# Patient Record
Sex: Male | Born: 1965 | Race: Black or African American | Hispanic: No | Marital: Married | Smoking: Never smoker
Health system: Southern US, Community
[De-identification: ages and names within clinical notes are randomized; demographics above are authoritative.]

## PROBLEM LIST (undated history)

## (undated) DIAGNOSIS — I82409 Acute embolism and thrombosis of unspecified deep veins of unspecified lower extremity: Secondary | ICD-10-CM

## (undated) DIAGNOSIS — C089 Malignant neoplasm of major salivary gland, unspecified: Secondary | ICD-10-CM

## (undated) DIAGNOSIS — I10 Essential (primary) hypertension: Secondary | ICD-10-CM

## (undated) HISTORY — PX: ABDOMINAL SURGERY: SHX537

## (undated) HISTORY — PX: BLADDER SURGERY: SHX569

## (undated) HISTORY — PX: TONSILLECTOMY: SUR1361

---

## 2011-11-12 ENCOUNTER — Emergency Department (HOSPITAL_BASED_OUTPATIENT_CLINIC_OR_DEPARTMENT_OTHER)
Admission: EM | Admit: 2011-11-12 | Discharge: 2011-11-12 | Disposition: A | Discharge status: Home / Self Care | Payer: Managed Care, Other (non HMO) | Attending: Emergency Medicine | Admitting: Emergency Medicine

## 2011-11-12 ENCOUNTER — Encounter (HOSPITAL_BASED_OUTPATIENT_CLINIC_OR_DEPARTMENT_OTHER): Payer: Self-pay | Admitting: *Deleted

## 2011-11-12 DIAGNOSIS — R0600 Dyspnea, unspecified: Secondary | ICD-10-CM

## 2011-11-12 DIAGNOSIS — R0602 Shortness of breath: Secondary | ICD-10-CM | POA: Insufficient documentation

## 2011-11-12 HISTORY — DX: Acute embolism and thrombosis of unspecified deep veins of unspecified lower extremity: I82.409

## 2011-11-12 HISTORY — DX: Essential (primary) hypertension: I10

## 2011-11-12 NOTE — ED Provider Notes (Signed)
History     CSN: [id number]  Arrival date & time 11/12/11  0224   First MD Initiated Contact with Patient 11/12/11 0258      Chief Complaint  Patient presents with  . Shortness of Breath    (Consider location/radiation/quality/duration/timing/severity/associated sxs/prior treatment) HPI This is a 46 year old black male who had a right tonsillectomy 3 days ago to remove a tonsillar mass. He had been doing well, not requiring any pain medicine in over a day and with less subjective sense of swelling in his throat. He awoke this morning just prior to arrival with a sensation that he could not breathe. He states he was breathing and objectively mood that he was breathing but had a subjective sensation of dyspnea. This resolved after about 20 minutes. He is now asymptomatic. There is no bleeding of his throat. He states he was sleeping lying down.  Past Medical History  Diagnosis Date  . Hypertension   . DVT (deep venous thrombosis)     Past Surgical History  Procedure Date  . Tonsillectomy   . Bladder surgery   . Abdominal surgery     No family history on file.  History  Substance Use Topics  . Smoking status: Never Smoker   . Smokeless tobacco: Never Used  . Alcohol Use: No      Review of Systems  All other systems reviewed and are negative.    Allergies  Review of patient's allergies indicates no known allergies.  Home Medications   Current Outpatient Rx  Name Route Sig Dispense Refill  . AMOXICILLIN 400 MG/5ML PO SUSR Oral Take 800 mg by mouth 2 (two) times daily.    Marland Kitchen ENOXAPARIN SODIUM 120 MG/0.8ML [place] SOLN Subcutaneous Inject 120 mg into the skin every 12 (twelve) hours.    Marland Kitchen LISINOPRIL 20 MG PO TABS Oral Take 20 mg by mouth daily.    . OXYCODONE-ACETAMINOPHEN 10-650 MG PO TABS Oral Take 1 tablet by mouth every 6 (six) hours as needed.    . WARFARIN SODIUM 5 MG PO TABS Oral Take 15 mg by mouth daily.      BP 128/76  Pulse 73  Temp 97.5 F (36.4 C)   Resp 18  SpO2 98%  Physical Exam General: Well-developed, well-nourished male in no acute distress; appearance consistent with age of record HENT: normocephalic, atraumatic; oropharynx status post right tonsillectomy with hemostatic wound and mild edema of soft palate; no stridor; no dysphonia Eyes: pupils equal round and reactive to light; extraocular muscles intact Neck: supple Heart: regular rate and rhythm Lungs: clear to auscultation bilaterally Abdomen: soft; nondistended; nontender Extremities: No deformity; full range of motion Neurologic: Awake, alert and oriented; motor function intact in all extremities and symmetric; no facial droop Skin: Warm and dry Psychiatric: Normal mood and affect    ED Course  Procedures (including critical care time)     MDM  Suspect the patient had an apneic episode related to postoperative changes. He was advised to sleep sitting more upright such as in the easy chair or on several pillows.         Hanley Seamen, MD 11/12/11 [phone]

## 2011-11-12 NOTE — ED Notes (Signed)
D/c home with family

## 2011-11-12 NOTE — ED Notes (Signed)
Pt had tonsillectomy on Friday- woke up tonight feeling SOB- EMS transport- pt reports sx resolving upon arrival to ED- speaking full sentences, no respiratory distress

## 2011-11-12 NOTE — Discharge Instructions (Signed)
Dyspnea Shortness of breath (dyspnea) is the feeling of uneasy breathing. Dyspnea should be evaluated promptly. A cause for your shortness of breath may not be identified initially. In this case, it is important to have a follow-up exam with your caregiver. HOME CARE INSTRUCTIONS   Do not smoke. Smoking is a common cause of shortness of breath. Ask for help to stop smoking.   Avoid being around chemicals that may bother your breathing, such as paint fumes or dust.   Rest as needed. Slowly begin your usual activities.   If medications were prescribed, take them as directed for the full length of time directed. This includes oxygen and any inhaled medications, if prescribed.   It is very important that you follow up with your caregiver or other physician as directed. Waiting to do so or failure to follow up could result in worsening of your condition, possible disability, or death.   Be sure you understand what to do or who to call if your shortness of breath worsens.  SEEK MEDICAL CARE IF:   Your condition does not improve in the time expected.   You have a hard time doing your normal activities even with rest.   You have any side effects from or problems with medications prescribed.  SEEK IMMEDIATE MEDICAL CARE IF:   You feel your shortness of breath is getting worse.   You feel lightheaded, faint or develop a cough not controlled with medications.   You start coughing up blood.   You get pain with breathing.   You get chest pain or pain in your arms, shoulders or belly (abdomen).   You have a fever.   You are unable to walk up stairs or exercise the way you normally can.  MAKE SURE YOU:   Understand these instructions.   Will watch your condition.   Will get help right away if you are not doing well or get worse.  Document Released: 07/12/2004 Document Revised: 02/14/2011 Document Reviewed: 10/20/2009 [facility] Patient Information 2012 [place], [place].

## 2012-07-03 ENCOUNTER — Emergency Department (HOSPITAL_BASED_OUTPATIENT_CLINIC_OR_DEPARTMENT_OTHER)
Admission: EM | Admit: 2012-07-03 | Discharge: 2012-07-03 | Disposition: A | Discharge status: Home / Self Care | Payer: Managed Care, Other (non HMO) | Attending: Emergency Medicine | Admitting: Emergency Medicine

## 2012-07-03 ENCOUNTER — Encounter (HOSPITAL_BASED_OUTPATIENT_CLINIC_OR_DEPARTMENT_OTHER): Payer: Self-pay | Admitting: *Deleted

## 2012-07-03 ENCOUNTER — Emergency Department (HOSPITAL_BASED_OUTPATIENT_CLINIC_OR_DEPARTMENT_OTHER): Payer: Managed Care, Other (non HMO)

## 2012-07-03 DIAGNOSIS — Z9889 Other specified postprocedural states: Secondary | ICD-10-CM | POA: Insufficient documentation

## 2012-07-03 DIAGNOSIS — Z7901 Long term (current) use of anticoagulants: Secondary | ICD-10-CM | POA: Insufficient documentation

## 2012-07-03 DIAGNOSIS — R1110 Vomiting, unspecified: Secondary | ICD-10-CM | POA: Insufficient documentation

## 2012-07-03 DIAGNOSIS — Z86718 Personal history of other venous thrombosis and embolism: Secondary | ICD-10-CM | POA: Insufficient documentation

## 2012-07-03 DIAGNOSIS — I10 Essential (primary) hypertension: Secondary | ICD-10-CM | POA: Insufficient documentation

## 2012-07-03 DIAGNOSIS — Z79899 Other long term (current) drug therapy: Secondary | ICD-10-CM | POA: Insufficient documentation

## 2012-07-03 DIAGNOSIS — Z9221 Personal history of antineoplastic chemotherapy: Secondary | ICD-10-CM | POA: Insufficient documentation

## 2012-07-03 DIAGNOSIS — Z85858 Personal history of malignant neoplasm of other endocrine glands: Secondary | ICD-10-CM | POA: Insufficient documentation

## 2012-07-03 HISTORY — DX: Malignant neoplasm of major salivary gland, unspecified: C08.9

## 2012-07-03 LAB — CBC WITH DIFFERENTIAL/PLATELET
Eosinophils Absolute: 0 10*3/uL (ref 0.0–0.7)
Eosinophils Relative: 0 % (ref 0–5)
HCT: 39.6 % (ref 39.0–52.0)
Lymphocytes Relative: 10 % — ABNORMAL LOW (ref 12–46)
Lymphs Abs: 0.5 10*3/uL — ABNORMAL LOW (ref 0.7–4.0)
MCH: 29.9 pg (ref 26.0–34.0)
MCV: 89.8 fL (ref 78.0–100.0)
Monocytes Absolute: 0.3 10*3/uL (ref 0.1–1.0)
RBC: 4.41 MIL/uL (ref 4.22–5.81)
RDW: 14.6 % (ref 11.5–15.5)
WBC: 4.9 10*3/uL (ref 4.0–10.5)

## 2012-07-03 LAB — COMPREHENSIVE METABOLIC PANEL
CO2: 27 mEq/L (ref 19–32)
Calcium: 10 mg/dL (ref 8.4–10.5)
Creatinine, Ser: 1 mg/dL (ref 0.50–1.35)
GFR calc Af Amer: >90 mL/min (ref >90)
GFR calc non Af Amer: 90 mL/min — ABNORMAL LOW (ref >90)
Glucose, Bld: 90 mg/dL (ref 70–99)
Total Protein: 7.6 g/dL (ref 6.0–8.3)

## 2012-07-03 LAB — PROTIME-INR
INR: 4.26 — ABNORMAL HIGH (ref 0.00–1.49)
Prothrombin Time: 38.3 seconds — ABNORMAL HIGH (ref 11.6–15.2)

## 2012-07-03 MED ORDER — PROMETHAZINE HCL 25 MG RE SUPP: 25.0000 mg | Freq: Four times a day (QID) | RECTAL | Status: DC | PRN

## 2012-07-03 MED ORDER — SODIUM CHLORIDE 0.9 % IV SOLN
Freq: Once | INTRAVENOUS | Status: AC
  Administered 2012-07-03: 15:00:00 via INTRAVENOUS

## 2012-07-03 MED ORDER — ONDANSETRON HCL 4 MG/2ML IJ SOLN
4.0000 mg | Freq: Once | INTRAMUSCULAR | Status: AC
  Administered 2012-07-03: 4 mg via INTRAVENOUS
  Filled 2012-07-03: qty 2

## 2012-07-03 MED ORDER — ONDANSETRON 4 MG PO TBDP: 4.0000 mg | ORAL_TABLET | Freq: Three times a day (TID) | ORAL | Status: DC | PRN

## 2012-07-03 NOTE — ED Notes (Addendum)
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  Worse symptoms over the last 2 weeks. Patient states he developed chronic constipation and vomiting since beginning fentanyl patches for pain control.

## 2012-07-03 NOTE — ED Provider Notes (Signed)
History     CSN: [id number]  Arrival date & time 07/03/12  1049   First MD Initiated Contact with Patient 07/03/12 1340      Chief Complaint  Patient presents with  . Emesis    (Consider location/radiation/quality/duration/timing/severity/associated sxs/prior treatment) Patient is a 47 y.o. male presenting with vomiting. The history is provided by the patient. No language interpreter was used.  Emesis  This is a new problem. Episode onset: over 2 months. The problem occurs continuously. The problem has been gradually worsening. The emesis has an appearance of stomach contents. There has been no fever. Pertinent negatives include no diarrhea.  Pt reports he has had throat cancer and had a tonsillectomy and radiation to throat.  Pt has finished treatment.  Pt reports that he continues to be nauseated.  Pt worried that throat is not healing normally.  Pt has not seen ENT since surgery.   Past Medical History  Diagnosis Date  . Hypertension   . DVT (deep venous thrombosis)   . Salivary gland cancer     Past Surgical History  Procedure Date  . Tonsillectomy   . Bladder surgery   . Abdominal surgery     No family history on file.  History  Substance Use Topics  . Smoking status: Never Smoker   . Smokeless tobacco: Never Used  . Alcohol Use: No      Review of Systems  Gastrointestinal: Positive for vomiting. Negative for diarrhea.  All other systems reviewed and are negative.    Allergies  Ketamine  Home Medications   Current Outpatient Rx  Name  Route  Sig  Dispense  Refill  . FENTANYL 12 MCG/HR TD PT72   Transdermal   Place 1 patch onto the skin every 3 (three) days.         . FENTANYL 25 MCG/HR TD PT72   Transdermal   Place 1 patch onto the skin every 3 (three) days.         . TRAMADOL HCL 50 MG PO TABS   Oral   Take 50 mg by mouth every 6 (six) hours as needed.         . AMOXICILLIN 400 MG/5ML PO SUSR   Oral   Take 800 mg by mouth 2 (two)  times daily.         Marland Kitchen ENOXAPARIN SODIUM 120 MG/0.8ML [place] SOLN   Subcutaneous   Inject 120 mg into the skin every 12 (twelve) hours.         Marland Kitchen LISINOPRIL 20 MG PO TABS   Oral   Take 20 mg by mouth daily.         . OXYCODONE-ACETAMINOPHEN 10-650 MG PO TABS   Oral   Take 1 tablet by mouth every 6 (six) hours as needed.         . WARFARIN SODIUM 5 MG PO TABS   Oral   Take 15 mg by mouth daily.           BP 102/68  Pulse 91  Temp 98.8 F (37.1 C) (Oral)  Resp 20  Ht 5\' 8"  (1.727 m)  Wt 215 lb (97.523 kg)  BMI 32.69 kg/m2  SpO2 100%  Physical Exam  Nursing note and vitals reviewed. Constitutional: He appears well-developed and well-nourished.  HENT:  Head: Normocephalic and atraumatic.  Eyes: Conjunctivae normal are normal. Pupils are equal, round, and reactive to light.  Neck: Normal range of motion. Neck supple.  Cardiovascular: Normal rate.   Pulmonary/Chest:  Effort normal.  Abdominal: Soft. Bowel sounds are normal.  Musculoskeletal: Normal range of motion.  Neurological: He is alert.  Skin: Skin is warm.    ED Course  Procedures (including critical care time)   Labs Reviewed  URINALYSIS, ROUTINE W REFLEX MICROSCOPIC   No results found.   1. Vomiting       MDM    Pt given IV fluids and zofran.   Pt reports he feels some better.  Labs obtained.   Kub shows some constipation.   Pt given rx for phenergan and zofran.   I advised pt to see his ENT for evaluation.       Lonia Skinner [place], [place] 07/03/12 2005

## 2012-07-06 NOTE — ED Provider Notes (Signed)
Medical screening examination/treatment/procedure(s) were performed by non-physician practitioner and as supervising physician I was immediately available for consultation/collaboration.   Shelda Jakes, MD 07/06/12 1414

## 2020-09-15 ENCOUNTER — Emergency Department (HOSPITAL_BASED_OUTPATIENT_CLINIC_OR_DEPARTMENT_OTHER): Payer: Medicare HMO

## 2020-09-15 ENCOUNTER — Other Ambulatory Visit: Payer: Self-pay

## 2020-09-15 ENCOUNTER — Emergency Department (HOSPITAL_BASED_OUTPATIENT_CLINIC_OR_DEPARTMENT_OTHER)
Admission: EM | Admit: 2020-09-15 | Discharge: 2020-09-15 | Disposition: A | Discharge status: Home / Self Care | Payer: Medicare HMO | Attending: Emergency Medicine | Admitting: Emergency Medicine

## 2020-09-15 ENCOUNTER — Encounter (HOSPITAL_BASED_OUTPATIENT_CLINIC_OR_DEPARTMENT_OTHER): Payer: Self-pay | Admitting: *Deleted

## 2020-09-15 DIAGNOSIS — I10 Essential (primary) hypertension: Secondary | ICD-10-CM | POA: Diagnosis not present

## 2020-09-15 DIAGNOSIS — N23 Unspecified renal colic: Secondary | ICD-10-CM | POA: Diagnosis not present

## 2020-09-15 DIAGNOSIS — R109 Unspecified abdominal pain: Secondary | ICD-10-CM | POA: Diagnosis present

## 2020-09-15 DIAGNOSIS — R55 Syncope and collapse: Secondary | ICD-10-CM | POA: Insufficient documentation

## 2020-09-15 DIAGNOSIS — Z85818 Personal history of malignant neoplasm of other sites of lip, oral cavity, and pharynx: Secondary | ICD-10-CM | POA: Diagnosis not present

## 2020-09-15 DIAGNOSIS — Z79899 Other long term (current) drug therapy: Secondary | ICD-10-CM | POA: Insufficient documentation

## 2020-09-15 LAB — CBC WITH DIFFERENTIAL/PLATELET
Abs Immature Granulocytes: 0.02 10*3/uL (ref 0.00–0.07)
Basophils Absolute: 0.1 10*3/uL (ref 0.0–0.1)
Basophils Relative: 1 %
Eosinophils Absolute: 0.1 10*3/uL (ref 0.0–0.5)
Eosinophils Relative: 1 %
HCT: 43 % (ref 39.0–52.0)
Hemoglobin: 14.1 g/dL (ref 13.0–17.0)
Immature Granulocytes: 0 %
Lymphocytes Relative: 22 %
Lymphs Abs: 1.8 10*3/uL (ref 0.7–4.0)
MCH: 30.2 pg (ref 26.0–34.0)
MCHC: 32.8 g/dL (ref 30.0–36.0)
MCV: 92.1 fL (ref 80.0–100.0)
Monocytes Absolute: 0.4 10*3/uL (ref 0.1–1.0)
Monocytes Relative: 5 %
Neutro Abs: 6 10*3/uL (ref 1.7–7.7)
Neutrophils Relative %: 71 %
Platelets: 246 10*3/uL (ref 150–400)
RBC: 4.67 MIL/uL (ref 4.22–5.81)
RDW: 14.6 % (ref 11.5–15.5)
WBC: 8.4 10*3/uL (ref 4.0–10.5)
nRBC: 0 % (ref 0.0–0.2)

## 2020-09-15 LAB — PROTIME-INR
INR: 1.1 (ref 0.8–1.2)
Prothrombin Time: 13.5 seconds (ref 11.4–15.2)

## 2020-09-15 LAB — COMPREHENSIVE METABOLIC PANEL
ALT: 19 U/L (ref 0–44)
AST: 28 U/L (ref 15–41)
Albumin: 4.6 g/dL (ref 3.5–5.0)
Alkaline Phosphatase: 73 U/L (ref 38–126)
Anion gap: 11 (ref 5–15)
BUN: 16 mg/dL (ref 6–20)
CO2: 23 mmol/L (ref 22–32)
Calcium: 8.8 mg/dL — ABNORMAL LOW (ref 8.9–10.3)
Chloride: 104 mmol/L (ref 98–111)
Creatinine, Ser: 1.41 mg/dL — ABNORMAL HIGH (ref 0.61–1.24)
GFR, Estimated: 60 mL/min — ABNORMAL LOW (ref >60)
Glucose, Bld: 157 mg/dL — ABNORMAL HIGH (ref 70–99)
Potassium: 3.6 mmol/L (ref 3.5–5.1)
Sodium: 138 mmol/L (ref 135–145)
Total Bilirubin: 0.4 mg/dL (ref 0.3–1.2)
Total Protein: 7.2 g/dL (ref 6.5–8.1)

## 2020-09-15 LAB — CBG MONITORING, ED: Glucose-Capillary: 145 mg/dL — ABNORMAL HIGH (ref 70–99)

## 2020-09-15 LAB — LIPASE, BLOOD: Lipase: 40 U/L (ref 11–51)

## 2020-09-15 MED ORDER — IOHEXOL 300 MG/ML  SOLN
100.0000 mL | Freq: Once | INTRAMUSCULAR | Status: AC | PRN
  Administered 2020-09-15: 100 mL via INTRAVENOUS

## 2020-09-15 MED ORDER — PROMETHAZINE HCL 25 MG/ML IJ SOLN
INTRAMUSCULAR | Status: AC
  Filled 2020-09-15: qty 1

## 2020-09-15 MED ORDER — ONDANSETRON HCL 4 MG/2ML IJ SOLN: 4.0000 mg | Freq: Once | INTRAMUSCULAR | Status: DC

## 2020-09-15 MED ORDER — LORAZEPAM 2 MG/ML IJ SOLN
1.0000 mg | Freq: Once | INTRAMUSCULAR | Status: AC
  Administered 2020-09-15: 1 mg via INTRAVENOUS

## 2020-09-15 MED ORDER — ONDANSETRON HCL 4 MG/2ML IJ SOLN
4.0000 mg | Freq: Once | INTRAMUSCULAR | Status: DC
  Administered 2020-09-15: 4 mg via INTRAVENOUS

## 2020-09-15 MED ORDER — SODIUM CHLORIDE 0.9 % IV SOLN: INTRAVENOUS | Status: DC | PRN

## 2020-09-15 MED ORDER — SODIUM CHLORIDE 0.9 % IV SOLN
25.0000 mg | Freq: Once | INTRAVENOUS | Status: AC
  Administered 2020-09-15: 25 mg via INTRAVENOUS
  Filled 2020-09-15: qty 1

## 2020-09-15 MED ORDER — ONDANSETRON 4 MG PO TBDP: 4.0000 mg | ORAL_TABLET | Freq: Three times a day (TID) | ORAL | 0 refills | Status: AC | PRN

## 2020-09-15 MED ORDER — MORPHINE SULFATE (PF) 4 MG/ML IV SOLN
4.0000 mg | Freq: Once | INTRAVENOUS | Status: AC
  Administered 2020-09-15: 4 mg via INTRAVENOUS
  Filled 2020-09-15: qty 1

## 2020-09-15 MED ORDER — KETOROLAC TROMETHAMINE 15 MG/ML IJ SOLN
30.0000 mg | Freq: Once | INTRAMUSCULAR | Status: AC
  Administered 2020-09-15: 30 mg via INTRAVENOUS
  Filled 2020-09-15: qty 2

## 2020-09-15 MED ORDER — TRAMADOL HCL 50 MG PO TABS
50.0000 mg | ORAL_TABLET | Freq: Four times a day (QID) | ORAL | 0 refills | Status: AC | PRN
Start: 2020-09-15 — End: ?

## 2020-09-15 MED ORDER — TAMSULOSIN HCL 0.4 MG PO CAPS: 0.4000 mg | ORAL_CAPSULE | Freq: Every day | ORAL | 0 refills | Status: AC

## 2020-09-15 MED ORDER — ONDANSETRON HCL 4 MG/2ML IJ SOLN
4.0000 mg | Freq: Once | INTRAMUSCULAR | Status: DC
  Filled 2020-09-15: qty 2

## 2020-09-15 MED ORDER — LORAZEPAM 2 MG/ML IJ SOLN
INTRAMUSCULAR | Status: AC
  Filled 2020-09-15: qty 1

## 2020-09-15 NOTE — ED Notes (Signed)
EDP at bedside to discuss CT Head results and current plan of care.

## 2020-09-15 NOTE — ED Notes (Signed)
12 lead ECG obtained

## 2020-09-15 NOTE — ED Notes (Signed)
BEFAST ASSESSMENT - NEGATIVE VAN ASSESSMENT - NEGATIVE

## 2020-09-15 NOTE — ED Notes (Signed)
Pt retrieved from parking lot, found supine on pavement, placed on LSB by nursing staff and brought to exam room 7. Placed on cont cardiac monitoring and cont POX.

## 2020-09-15 NOTE — ED Notes (Signed)
Pt actively vomiting at this time, vomited a large amt of emesis x 2, EDP at bedside, denies HA, remains very alert and oriented, verbal orders rec for 4mg  Zofran IV push. RT at bedside to ensure airway patency. Suction set up. Safety measures remain in place.

## 2020-09-15 NOTE — ED Triage Notes (Signed)
Began having abdominal pain, onset approx 2100hrs, at approx 0030hrs, family states he vomited x 2, abd pain was worse. Pt described his pain as stabbing pain. Family noted he began to stumble.

## 2020-09-15 NOTE — ED Provider Notes (Signed)
[facility] DEPART[facility] Provider Note   CSN: [id number] Arrival date & time: 09/15/20  0048     History Chief Complaint  Patient presents with  . Abdominal Pain    LEFT SIDED ABD PAIN    Derek Peters is a 55 y.o. male.  HPI     This a 55 year old male with a history of DVT and hypertension who presents from triage with reported complaints of abdominal pain.  Nursing staff was called emergently to the parking lot as patient stepped out of the car and fell to the ground.  He was noted by nursing to have seizure-like activity.  No known history of seizures per the family.  Per family member at the bedside, reports that he started complaining of some left-sided abdominal discomfort earlier this evening.  He had multiple episodes of nonbilious, nonbloody emesis.  He did not complain of any headache and no noted strokelike symptoms.  Upon arrival to the room, patient was initially confused and did not know his name.  However, he subsequently was able to tell me where he was and his full name.  He denies any headache at this time.  Is complaining of left abdominal pain which he has never had before.  Limited history secondary to acute presentation Level 5 caveat  Past Medical History:  Diagnosis Date  . DVT (deep venous thrombosis) ([place])   . Hypertension   . 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   Past Surgical History:  Procedure Laterality Date  . ABDOMINAL SURGERY    . BLADDER SURGERY    . TONSILLECTOMY         History reviewed. No pertinent family history.  Social History   Tobacco Use  . Smoking status: Never Smoker  . Smokeless tobacco: Never Used  Vaping Use  . Vaping Use: Never used  Substance Use Topics  . Alcohol use: No  . Drug use: No    Home Medications Prior to Admission medications   Medication Sig Start Date End Date Taking? Authorizing Provider  ondansetron (ZOFRAN ODT) 4 MG  disintegrating tablet Take 1 tablet (4 mg total) by mouth every 8 (eight) hours as needed. 09/15/20  Yes Shelsie Tijerino, Barbette Hair, MD  tamsulosin (FLOMAX) 0.4 MG CAPS capsule Take 1 capsule (0.4 mg total) by mouth daily. 09/15/20  Yes Garlin Batdorf, Barbette Hair, MD  traMADol (ULTRAM) 50 MG tablet Take 1 tablet (50 mg total) by mouth every 6 (six) hours as needed. 09/15/20  Yes Tianni Escamilla, Barbette Hair, MD  fentaNYL (DURAGESIC - DOSED MCG/HR) 25 MCG/HR Place 1 patch onto the skin every 3 (three) days.    [provider]  lisinopril (PRINIVIL,ZESTRIL) 20 MG tablet Take 20 mg by mouth daily.    [provider]    Allergies    Ketamine  Review of Systems   Review of Systems  Unable to perform ROS: Mental status change (Postictal)    Physical Exam Updated Vital Signs BP 117/78   Pulse 83   Temp 98.2 F (36.8 C) (Oral)   Resp 19   Ht 1.727 m (5\' 8" )   Wt 125.5 kg   SpO2 96%   BMI 42.07 kg/m   Physical Exam Vitals and nursing note reviewed.  Constitutional:      Appearance: He is well-developed. He is obese.  HENT:     Head: Normocephalic and atraumatic.  Eyes:     Extraocular Movements: Extraocular movements intact.  Pupils: Pupils are equal, round, and reactive to light.     Comments: Pupils 3 mm and reactive bilaterally  Cardiovascular:     Rate and Rhythm: Regular rhythm. Tachycardia present.     Heart sounds: Normal heart sounds. No murmur heard.   Pulmonary:     Effort: Pulmonary effort is normal. No respiratory distress.     Breath sounds: Normal breath sounds. No wheezing.  Abdominal:     General: Bowel sounds are normal.     Palpations: Abdomen is soft.     Tenderness: There is abdominal tenderness in the left upper quadrant. There is no guarding or rebound.  Musculoskeletal:     Cervical back: Neck supple.  Lymphadenopathy:     Cervical: No cervical adenopathy.  Skin:    General: Skin is warm and dry.  Neurological:     Mental Status: He is alert.      Comments: Initially confused but ultimately oriented x3, 5 out of 5 strength in all 4 extremities, no drift, patient can name and repeat  Psychiatric:        Mood and Affect: Mood normal.     ED Results / Procedures / Treatments   Labs (all labs ordered are listed, but only abnormal results are displayed) Labs Reviewed  COMPREHENSIVE METABOLIC PANEL - Abnormal; Notable for the following components:      Result Value   Glucose, Bld 157 (*)    Creatinine, Ser 1.41 (*)    Calcium 8.8 (*)    GFR, Estimated 60 (*)    All other components within normal limits  CBG MONITORING, ED - Abnormal; Notable for the following components:   Glucose-Capillary 145 (*)    All other components within normal limits  CBC WITH DIFFERENTIAL/PLATELET  LIPASE, BLOOD  PROTIME-INR  RAPID URINE DRUG SCREEN, HOSP PERFORMED  ETHANOL  URINALYSIS, ROUTINE W REFLEX MICROSCOPIC    EKG EKG Interpretation  Date/Time:  Thursday September 15 2020 00:55:05 EDT Ventricular Rate:  105 PR Interval:  196 QRS Duration: 98 QT Interval:  346 QTC Calculation: 458 R Axis:   38 Text Interpretation: Sinus tachycardia Borderline T abnormalities, inferior leads Confirmed by Thayer Jew [phone]) on 09/15/2020 12:59:17 AM   Radiology CT Head Wo Contrast  Result Date: 09/15/2020 CLINICAL DATA:  Recent seizure activity EXAM: CT HEAD WITHOUT CONTRAST CT CERVICAL SPINE WITHOUT CONTRAST TECHNIQUE: Multidetector CT imaging of the head and cervical spine was performed following the standard protocol without intravenous contrast. Multiplanar CT image reconstructions of the cervical spine were also generated. 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 No findings to suggest acute hemorrhage, acute infarction or space-occupying mass lesion are noted. Vascular: No hyperdense vessel or unexpected calcification. Skull: Normal. Negative  for fracture or focal lesion. Sinuses/Orbits: No acute finding. Other: None. CT CERVICAL SPINE FINDINGS Alignment: Within normal limits. Skull base and vertebrae: 7 cervical segments are well visualized. Vertebral body height is well maintained. Mild disc space narrowing at C5-6 and C6-7 is noted with mild osteophytic changes. The odontoid is within normal limits. No acute fracture or acute facet abnormality is noted. Soft tissues and spinal canal: Surrounding soft tissues are limits. Upper chest: No acute abnormality is noted. Other: None IMPRESSION: CT of the head: Chronic left posterior parietal infarct. No acute abnormality noted. CT of the cervical spine: Degenerative change without acute abnormality. Electronically Signed   By: Inez Catalina M.D.   On:  09/15/2020 01:25   CT Cervical Spine Wo Contrast  Result Date: 09/15/2020 CLINICAL DATA:  Recent seizure activity EXAM: CT HEAD WITHOUT CONTRAST CT CERVICAL SPINE WITHOUT CONTRAST TECHNIQUE: Multidetector CT imaging of the head and cervical spine was performed following the standard protocol without intravenous contrast. Multiplanar CT image reconstructions of the cervical spine were also generated. 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 No findings to suggest acute hemorrhage, acute infarction or space-occupying mass lesion are noted. Vascular: No hyperdense vessel or unexpected calcification. Skull: Normal. Negative for fracture or focal lesion. Sinuses/Orbits: No acute finding. Other: None. CT CERVICAL SPINE FINDINGS Alignment: Within normal limits. Skull base and vertebrae: 7 cervical segments are well visualized. Vertebral body height is well maintained. Mild disc space narrowing at C5-6 and C6-7 is noted with mild osteophytic changes. The odontoid is within normal limits. No acute fracture or acute facet abnormality is noted. Soft tissues and  spinal canal: Surrounding soft tissues are limits. Upper chest: No acute abnormality is noted. Other: None IMPRESSION: CT of the head: Chronic left posterior parietal infarct. No acute abnormality noted. CT of the cervical spine: Degenerative change without acute abnormality. Electronically Signed   By: Inez Catalina M.D.   On: 09/15/2020 01:25   CT ABDOMEN PELVIS W CONTRAST  Result Date: 09/15/2020 CLINICAL DATA:  Left-sided abdominal pain, nausea and vomiting EXAM: CT ABDOMEN AND PELVIS WITH CONTRAST TECHNIQUE: Multidetector CT imaging of the abdomen and pelvis was performed using the standard protocol following bolus administration of intravenous contrast. CONTRAST:  140mL OMNIPAQUE IOHEXOL 300 MG/ML  SOLN COMPARISON:  None. FINDINGS: Lower chest: No acute abnormality. Hepatobiliary: No focal liver abnormality is seen. No gallstones, gallbladder wall thickening, or biliary dilatation. Pancreas: Unremarkable. No pancreatic ductal dilatation or surrounding inflammatory changes. Spleen: Normal in size without focal abnormality. Adrenals/Urinary Tract: Adrenal glands are within normal limits. The left kidney demonstrates nonobstructing calculi measuring up to 6 mm. Hydronephrosis and hydroureter is noted secondary to a 1 mm stone at the left UVJ. Right kidney demonstrates Peripelvic cysts without obstructive change. Bladder is decompressed. Stomach/Bowel: The appendix is not well visualized. No inflammatory changes to suggest appendicitis are noted. No obstructive or inflammatory changes of the colon are seen. Small bowel and stomach appear within normal limits. Vascular/Lymphatic: No enlarged abdominal or pelvic lymph nodes. Multiple venous collaterals are identified in the abdominal wall secondary to calcified thrombus within the iliac veins bilateral consistent with chronic DVT. The suprarenal IVC is within normal limits. The aorta is within normal limits. Reproductive: Prostate is unremarkable. Other: No  abdominal wall hernia or abnormality. No abdominopelvic ascites. Musculoskeletal: No acute or significant osseous findings. IMPRESSION: 1 mm left UVJ stone with moderate obstructive change. Nonobstructing left renal calculi. Changes consistent with calcification in the iliac veins bilaterally related to prior DVT. The infrarenal IVC is not well visualized and multiple venous collaterals are identified within the abdominal wall. Electronically Signed   By: Inez Catalina M.D.   On: 09/15/2020 02:33    Procedures Procedures   CRITICAL CARE Performed by: Merryl Hacker   Total critical care time: 33 minutes  Critical care time was exclusive of separately billable procedures and treating other patients.  Critical care was necessary to treat or prevent imminent or life-threatening deterioration.  Critical care was time spent personally by me on the following activities: development of treatment plan with patient and/or surrogate as well as nursing, discussions  with consultants, evaluation of patient's response to treatment, examination of patient, obtaining history from patient or surrogate, ordering and performing treatments and interventions, ordering and review of laboratory studies, ordering and review of radiographic studies, pulse oximetry and re-evaluation of patient's condition.   Medications Ordered in ED Medications  promethazine (PHENERGAN) 25 MG/ML injection (  Not Given 09/15/20 0336)  0.9 %  sodium chloride infusion ( Intravenous Infusion Verify 09/15/20 0342)  promethazine (PHENERGAN) 25 MG/ML injection (  Not Given 09/15/20 0339)  LORazepam (ATIVAN) injection 1 mg (1 mg Intravenous Given 09/15/20 0059)  morphine 4 MG/ML injection 4 mg (4 mg Intravenous Given 09/15/20 0148)  iohexol (OMNIPAQUE) 300 MG/ML solution 100 mL (100 mLs Intravenous Contrast Given 09/15/20 0158)  promethazine (PHENERGAN) 25 mg in sodium chloride 0.9 % 50 mL IVPB ( Intravenous Stopped 09/15/20 0336)  ketorolac  (TORADOL) 15 MG/ML injection 30 mg (30 mg Intravenous Given 09/15/20 0301)    ED Course  I have reviewed the triage vital signs and the nursing notes.  Pertinent labs & imaging results that were available during my care of the patient were reviewed by me and considered in my medical decision making (see chart for details).  Clinical Course as of 09/15/20 0542  Thu Sep 15, 2020  0538 Recently discussed patient with neurology, Dr. Leonel Ramsay, given history of significant stroke which could increase his seizure risk.  However, seizure does not fit his clinical scenario.  I am highly suspicious he may have had a syncopal episode with some myoclonus.  Regardless, given one-time episode, no need to start on prophylactic seizure medication per Dr. Leonel Ramsay. [CH]    Clinical Course User Index [CH] Keriana Sarsfield, Barbette Hair, MD   MDM Rules/Calculators/A&P                          Patient presents with abdominal pain.  He had an episode of loss of consciousness in the parking lot.  Seizure versus syncope.  On my initial evaluation he is confused but nontoxic.  Confusion cleared rapidly and his exam was nonfocal.  He had ongoing episodes of recurrent emesis and complaints of left abdominal pain.  He was given fluids, pain and nausea medication.  CT scan of the head was obtained given fall and shows no evidence of acute bleed.  He does have a significant old parietal stroke.  Labs obtained.  CT abdomen obtained to rule out intra-abdominal pathology.  Given vomiting and left-sided abdominal pain, kidney stone, colitis, obstruction are all considerations.  CT scan shows an obstructing 1 mm stone at the UVJ.  Highly suspect this is the etiology of the patient's abdominal pain and vomiting.  Given his recurrent retching, feel his episode in the parking lot was likely vasovagal in nature and he may have had some associated myoclonus.  See discussion above with neurology.  Patient is resting comfortably on multiple  rechecks.  He has been able to tolerate fluids.  Denies any pain at this time.  He and his wife are given instructions regarding expectant management for kidney stones.  Urology follow-up provided.  Will discharge with short course of pain medication, nausea medication, and Flomax.    Final Clinical Impression(s) / ED Diagnoses Final diagnoses:  Renal colic  Vasovagal syncope    Rx / DC Orders ED Discharge Orders         Ordered    traMADol (ULTRAM) 50 MG tablet  Every 6 hours PRN  09/15/20 0541    ondansetron (ZOFRAN ODT) 4 MG disintegrating tablet  Every 8 hours PRN        09/15/20 0541    tamsulosin (FLOMAX) 0.4 MG CAPS capsule  Daily        09/15/20 0541           Merryl Hacker, MD 09/15/20 [phone]

## 2020-09-15 NOTE — ED Notes (Signed)
To CT Scan via stretcher with RN, sr x 2 up with cont cardiac monitoring

## 2020-09-15 NOTE — ED Notes (Signed)
CBG obtained 145mg /dl

## 2020-09-15 NOTE — Discharge Instructions (Addendum)
You were seen today for abdominal pain.  He did have an episode of loss of consciousness which is likely passing out and not a seizure although given your history of stroke, you would be at increased risk.  Regardless, you do not need to be started on seizure medications.  Take pain and nausea medications as prescribed.  Follow-up with urology as needed if pain recurs.  Your stone is 1 mm and will likely pass on its own.

## 2020-09-15 NOTE — ED Notes (Signed)
ED Provider at bedside. 

## 2020-09-15 NOTE — ED Notes (Signed)
Seizure pads applied to stretcher, HIGH FALL RISK bracelet applied to rt arm.

## 2021-11-23 IMAGING — CT CT CERVICAL SPINE W/O CM
3 of 4 series · 12 of 33 positions shown, 14 images · non-contrast
Comparison: 02/13/2016

CLINICAL DATA: Recent seizure activity

EXAM:
CT HEAD WITHOUT CONTRAST
CT CERVICAL SPINE WITHOUT CONTRAST
TECHNIQUE: Multidetector CT imaging of the head and cervical spine was
performed following the standard protocol without intravenous
contrast. Multiplanar CT image reconstructions of the cervical spine
were also generated.

[Series 3: c_spine 2.0 i30s 3 · axial · 0.33mm/px · z∈[-245,-137]mm · 4 of 82 slices shown, 5 images]
[im 14/82  soft-tissue]
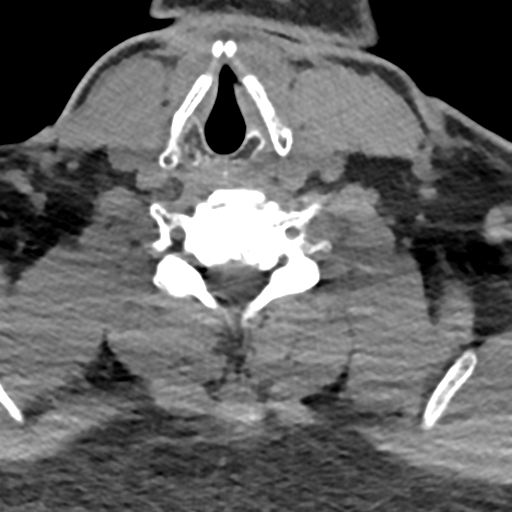
[im 14/82  bone]
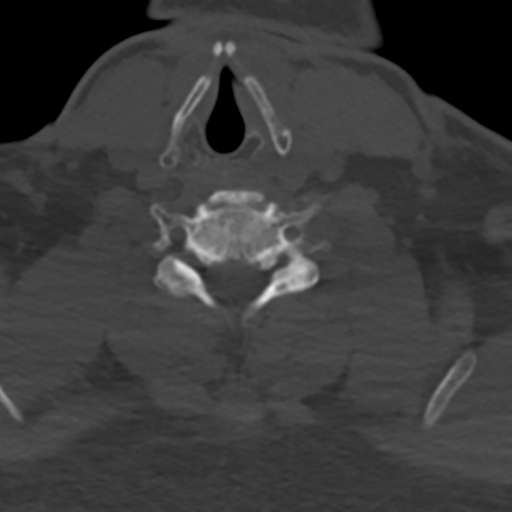
[im 28/82  bone]
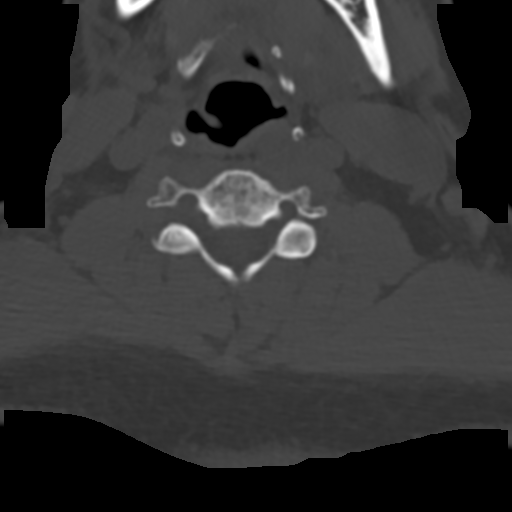
[im 55/82  bone]
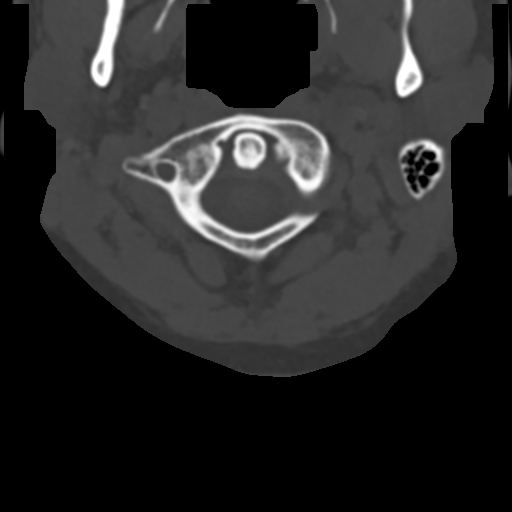
[im 68/82  bone]
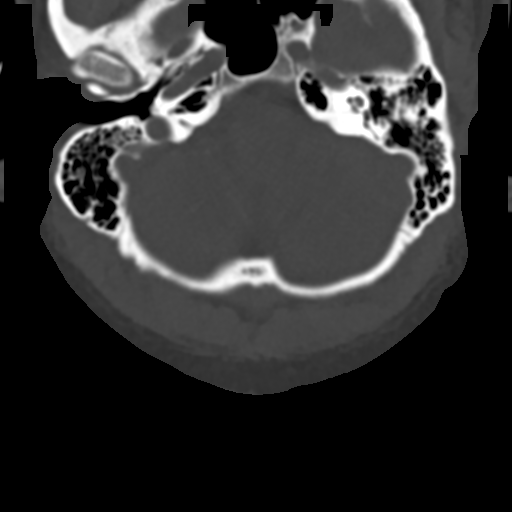

[Series 5: coronals · coronal · 0.27mm/px · 3 of 61 slices shown]
[im 13/61  bone]
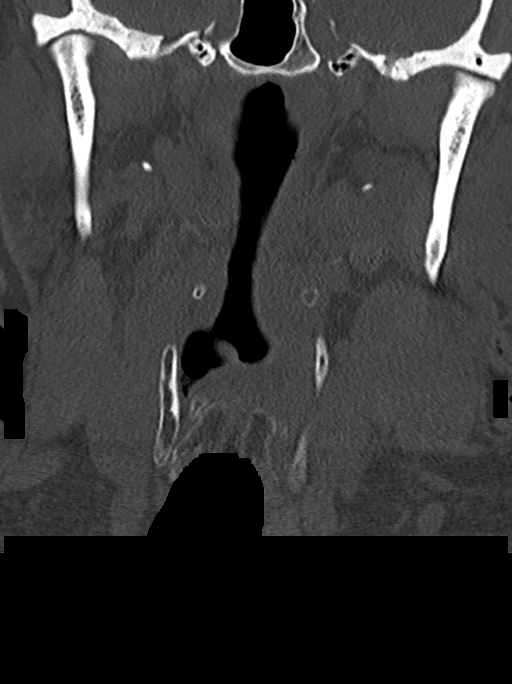
[im 25/61  bone]
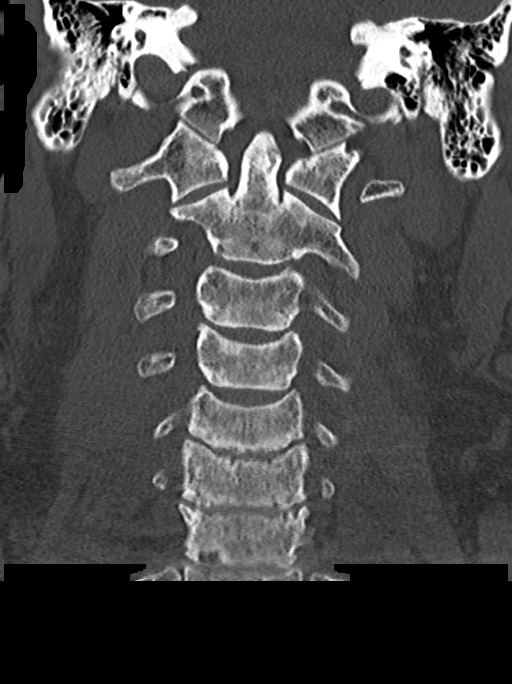
[im 37/61  bone]
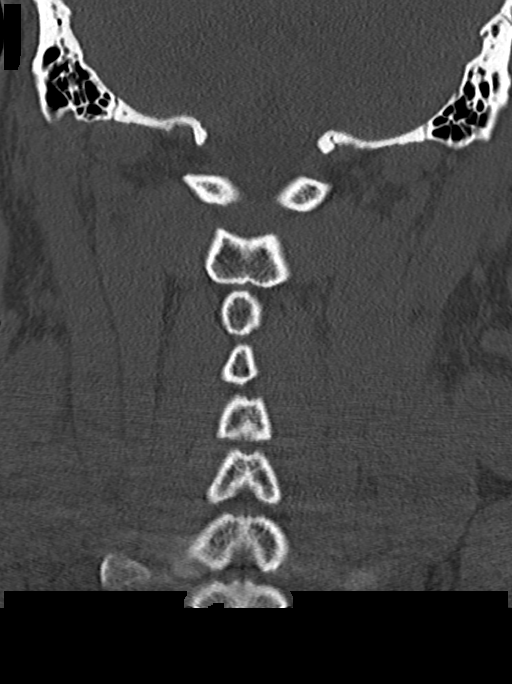

[Series 6: sagittals · sagittal · 0.24mm/px · 5 of 61 slices shown, 6 images]
[im 21/61  bone]
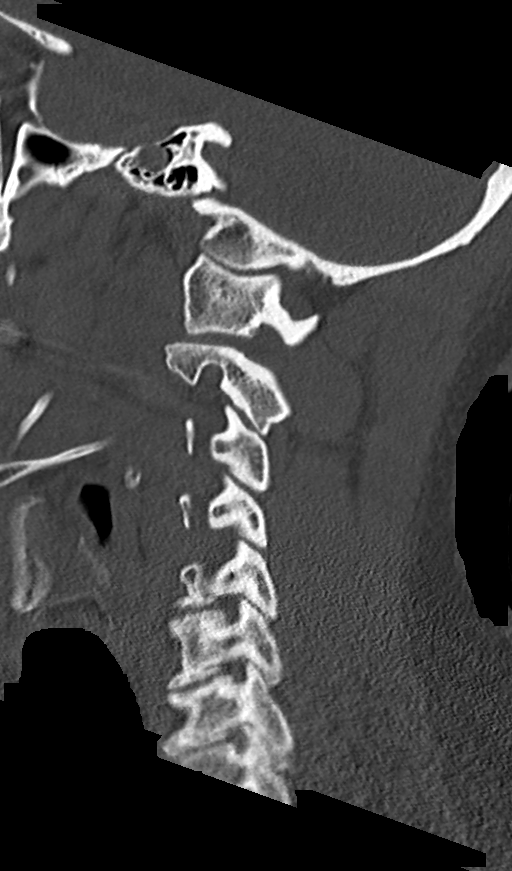
[im 26/61  bone]
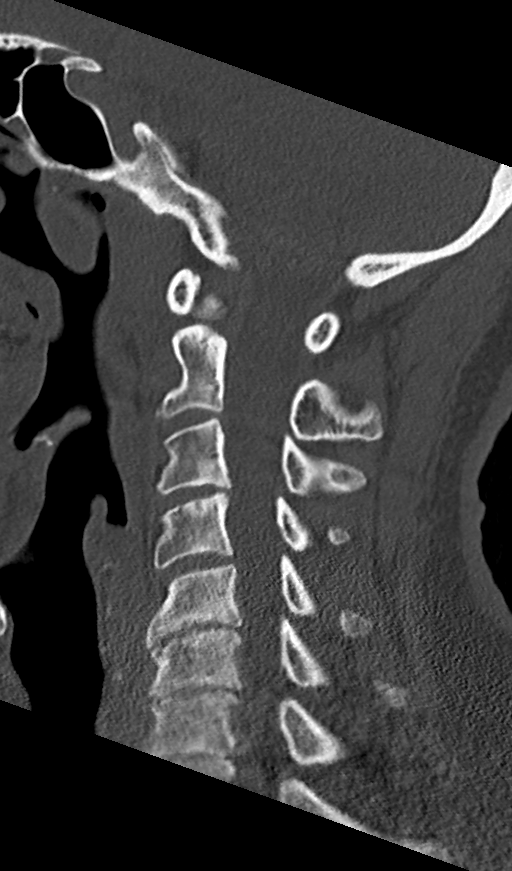
[im 31/61  soft-tissue]
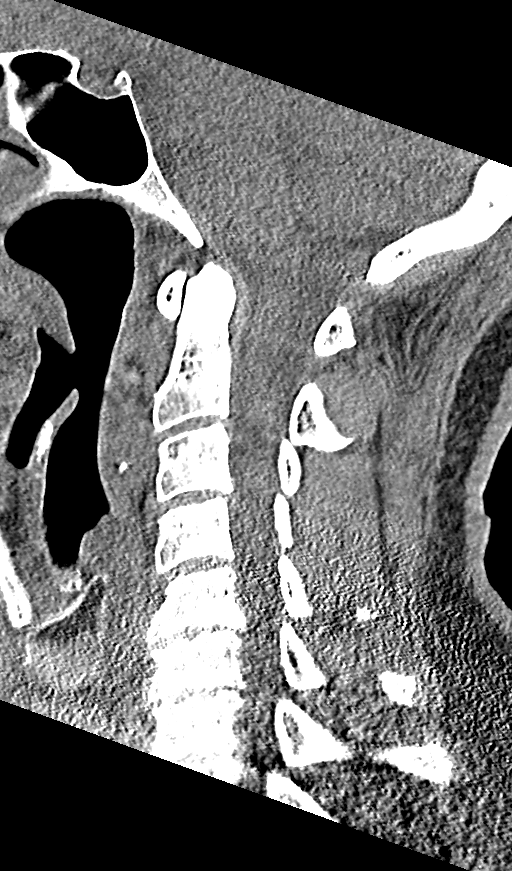
[im 31/61  bone]
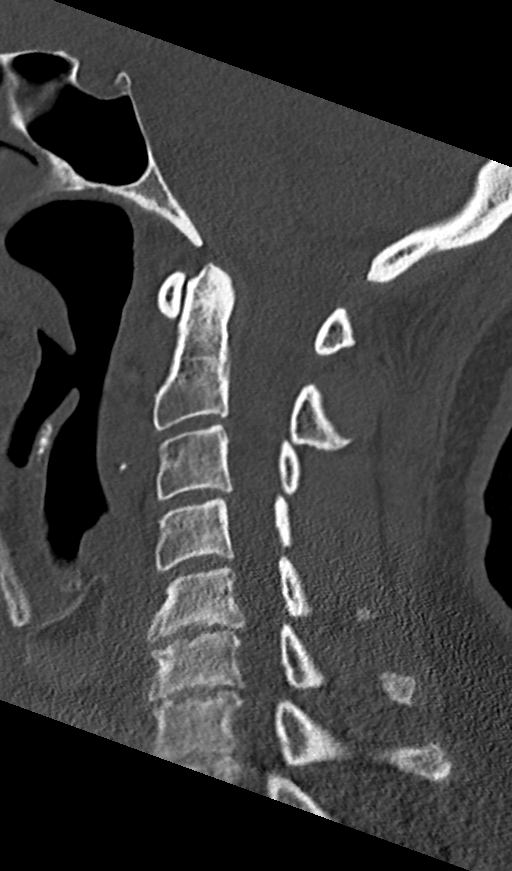
[im 36/61  bone]
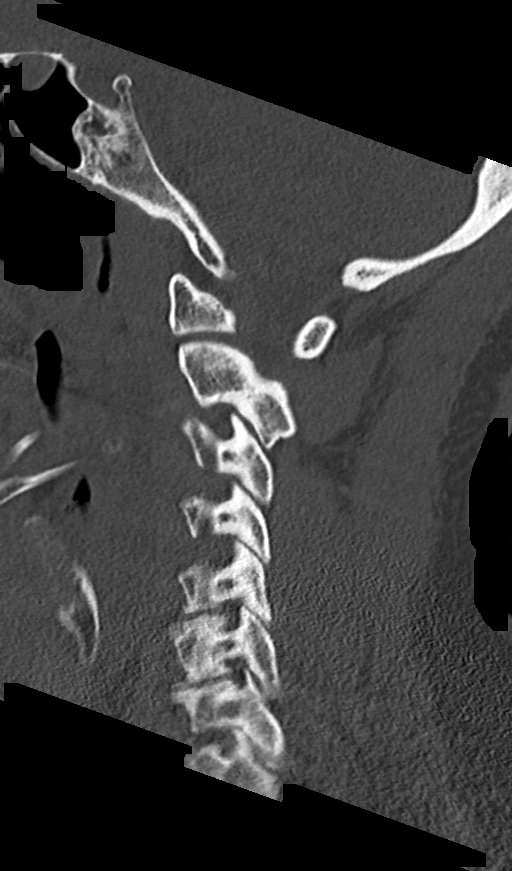
[im 41/61  bone]
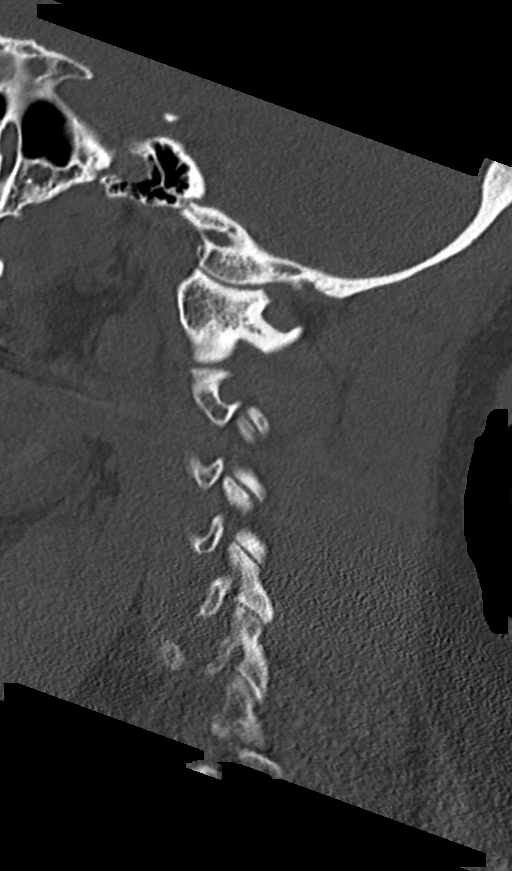

[12 of 33 positions shown; findings below may reference images not displayed]

FINDINGS: CT HEAD FINDINGS

Brain: Changes consistent with chronic left posterior parietal
infarct are again noted and stable. No findings to suggest acute
hemorrhage, acute infarction or space-occupying mass lesion are
noted.

Vascular: No hyperdense vessel or unexpected calcification.

Skull: Normal. Negative for fracture or focal lesion.

Sinuses/Orbits: No acute finding.

Other: None.

CT CERVICAL SPINE FINDINGS

Alignment: Within normal limits.

Skull base and vertebrae: 7 cervical segments are well visualized.
Vertebral body height is well maintained. Mild disc space narrowing
at C5-6 and C6-7 is noted with mild osteophytic changes. The
odontoid is within normal limits. No acute fracture or acute facet
abnormality is noted.

Soft tissues and spinal canal: Surrounding soft tissues are limits.

Upper chest: No acute abnormality is noted.

Other: None
IMPRESSION: CT of the head: Chronic left posterior parietal infarct. No acute
abnormality noted.

CT of the cervical spine: Degenerative change without acute
abnormality.
